# Patient Record
Sex: Female | Born: 1975 | Race: White | Hispanic: No | Marital: Married | Smoking: Never smoker
Health system: Southern US, Community
[De-identification: ages and names within clinical notes are randomized; demographics above are authoritative.]

---

## 2005-06-17 ENCOUNTER — Emergency Department: Payer: Self-pay | Admitting: Emergency Medicine

## 2006-10-04 ENCOUNTER — Emergency Department: Payer: Self-pay | Admitting: Emergency Medicine

## 2011-01-14 ENCOUNTER — Ambulatory Visit: Payer: Self-pay

## 2011-08-23 ENCOUNTER — Ambulatory Visit: Payer: Self-pay | Admitting: Family Medicine

## 2011-09-21 ENCOUNTER — Encounter: Payer: Self-pay | Admitting: Obstetrics & Gynecology

## 2011-10-10 ENCOUNTER — Emergency Department: Payer: Self-pay | Admitting: *Deleted

## 2011-10-10 LAB — URINALYSIS, COMPLETE
Bilirubin,UR: NEGATIVE
Leukocyte Esterase: NEGATIVE
Nitrite: NEGATIVE
Ph: 7 (ref 4.5–8.0)
Squamous Epithelial: <1

## 2012-02-13 ENCOUNTER — Ambulatory Visit: Payer: Self-pay | Admitting: Obstetrics and Gynecology

## 2012-02-13 LAB — CBC WITH DIFFERENTIAL/PLATELET
Basophil #: 0 10*3/uL (ref 0.0–0.1)
Basophil %: 0.3 %
Eosinophil #: 0.1 10*3/uL (ref 0.0–0.7)
Eosinophil %: 0.7 %
Lymphocyte #: 1.2 10*3/uL (ref 1.0–3.6)
Lymphocyte %: 14.6 %
MCH: 31.1 pg (ref 26.0–34.0)
MCHC: 34.2 g/dL (ref 32.0–36.0)
MCV: 91 fL (ref 80–100)
Monocyte #: 0.4 x10 3/mm (ref 0.2–0.9)
Monocyte %: 5.1 %
Neutrophil #: 6.3 10*3/uL (ref 1.4–6.5)
Neutrophil %: 79.3 %
RDW: 16 % — ABNORMAL HIGH (ref 11.5–14.5)
WBC: 8 10*3/uL (ref 3.6–11.0)

## 2012-02-14 ENCOUNTER — Inpatient Hospital Stay: Payer: Self-pay | Admitting: Obstetrics and Gynecology

## 2012-02-15 LAB — HEMATOCRIT: HCT: 26.5 % — ABNORMAL LOW (ref 35.0–47.0)

## 2013-01-20 IMAGING — US US OB US >=[ID] SNGL FETUS
1 series · 14 of 28 positions shown · non-contrast
Comparison: none

REASON FOR EXAM: CR [id number] Viability
COMMENTS:

[Series 1: us ob us >=(id) sngl fetus · 40 acquisitions, 14 frames shown]
[im 2/40]
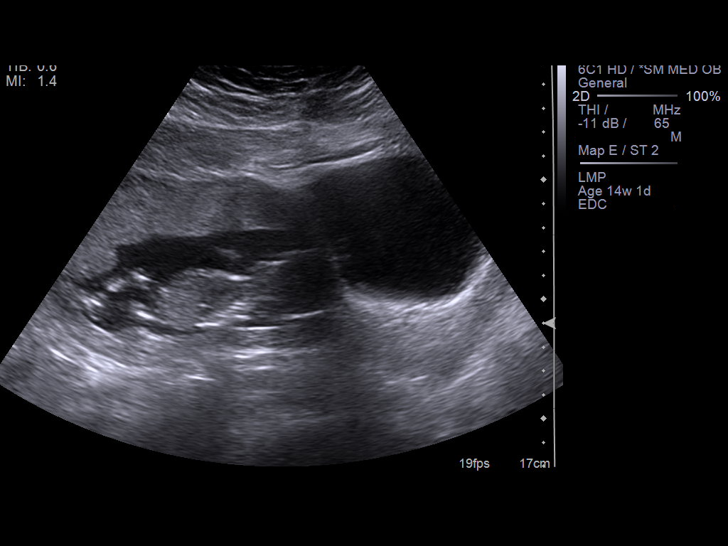
[im 5/40]
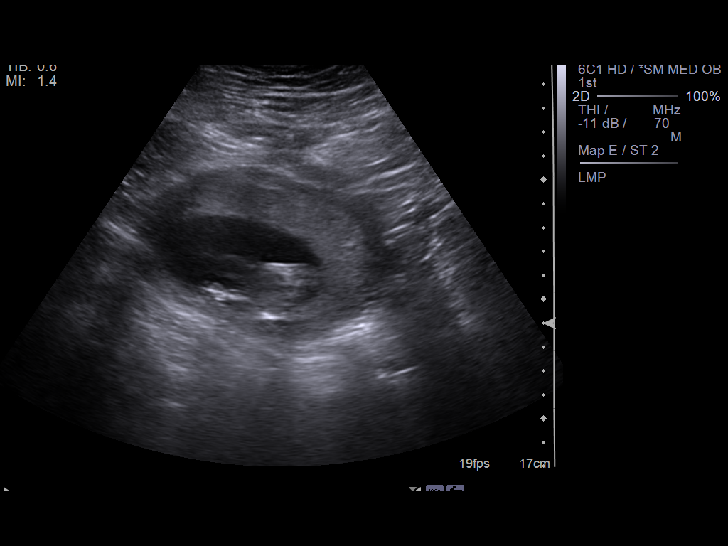
[im 8/40]
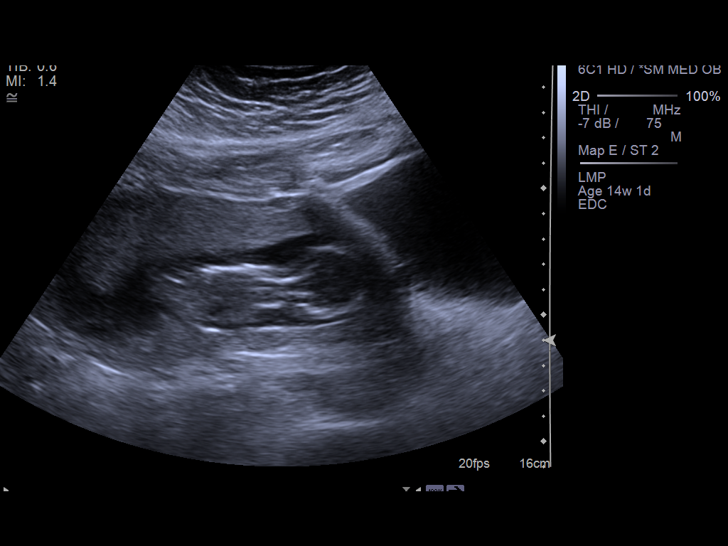
[im 11/40]
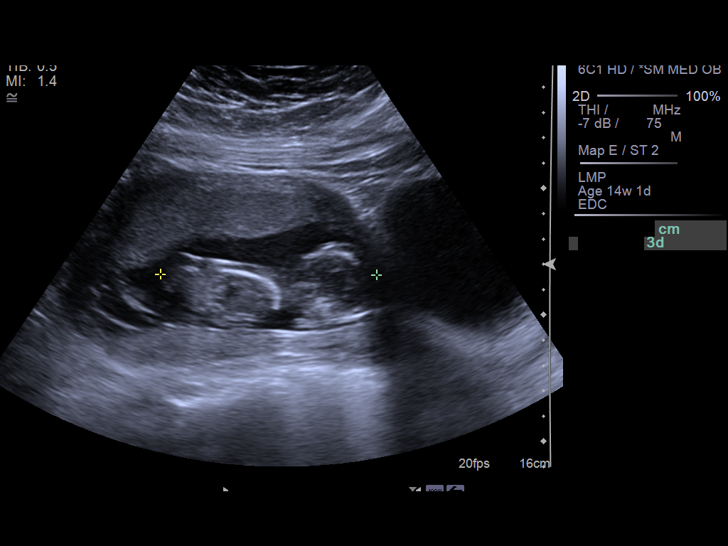
[im 14/40]
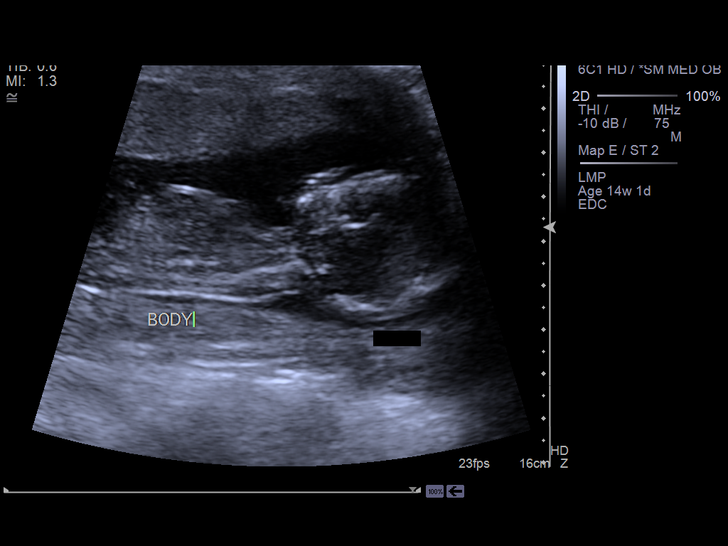
[im 16/40]
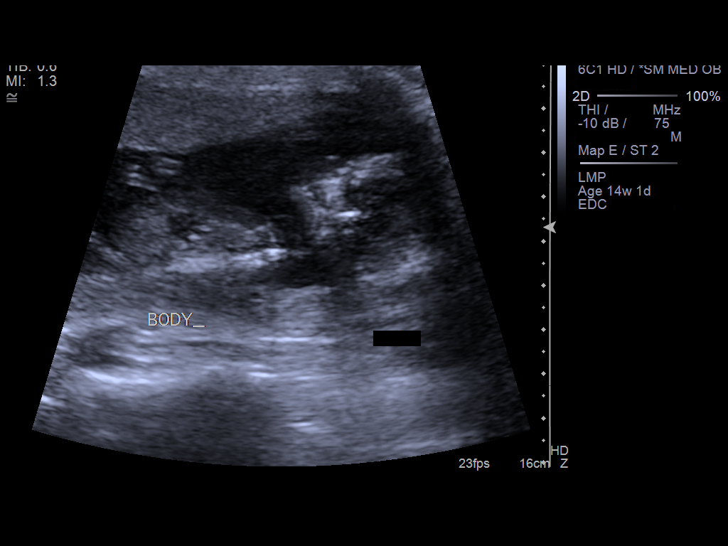
[im 19/40]
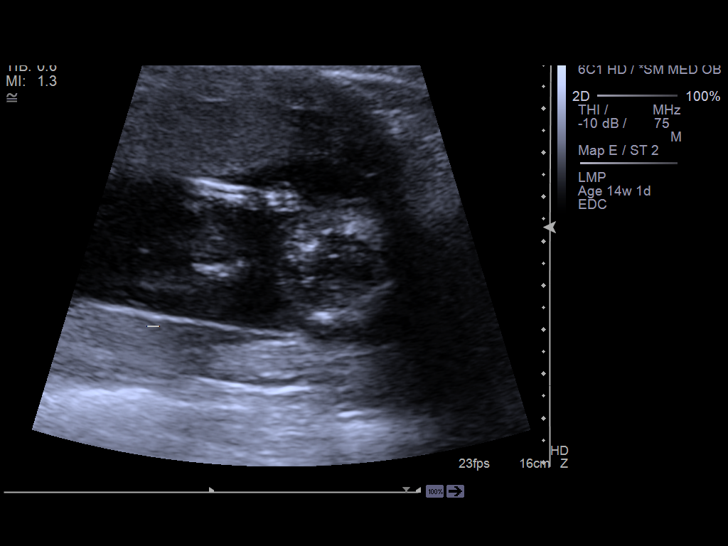
[im 22/40]
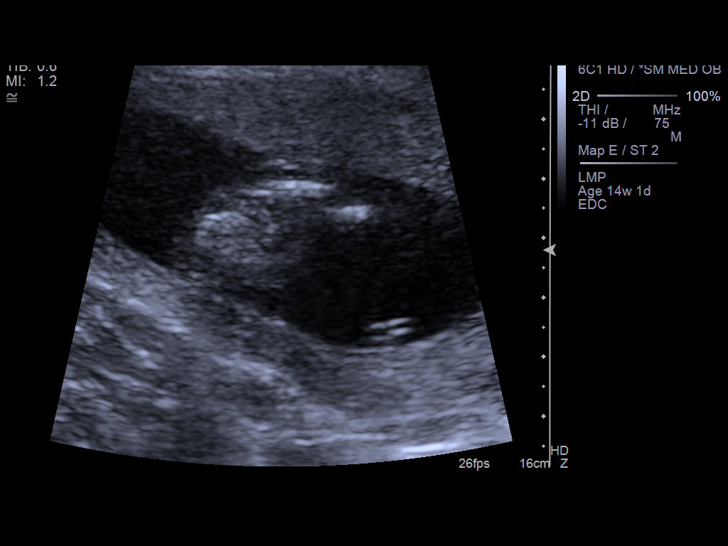
[im 25/40]
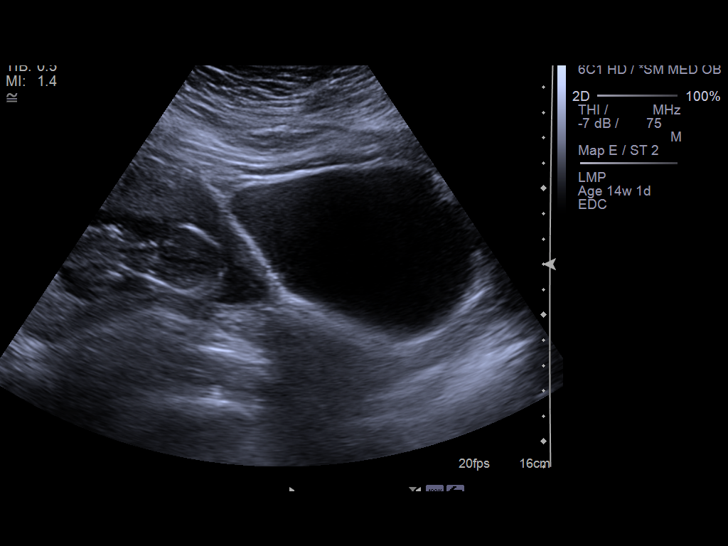
[im 28/40]
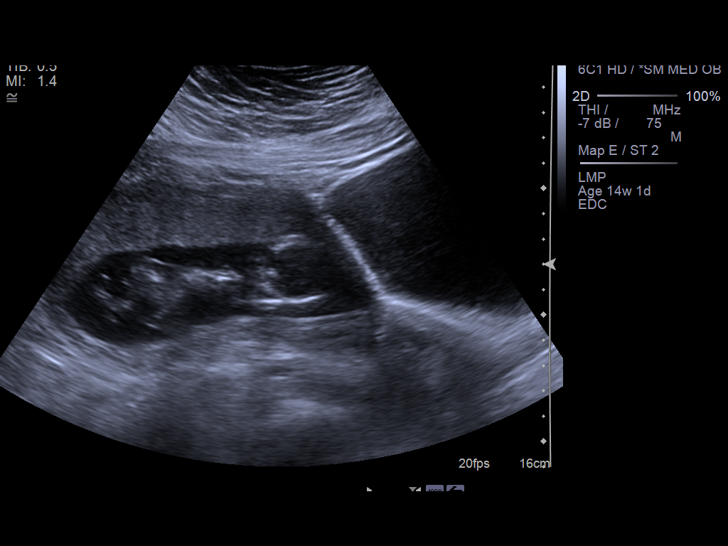
[im 31/40]
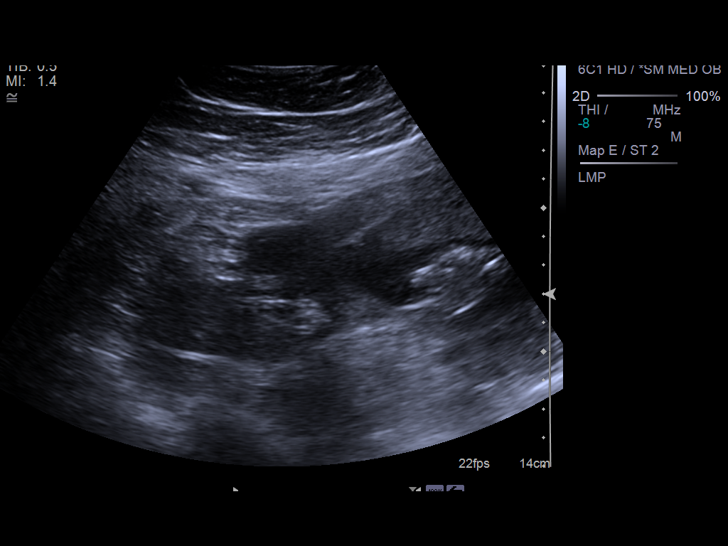
[im 34/40]
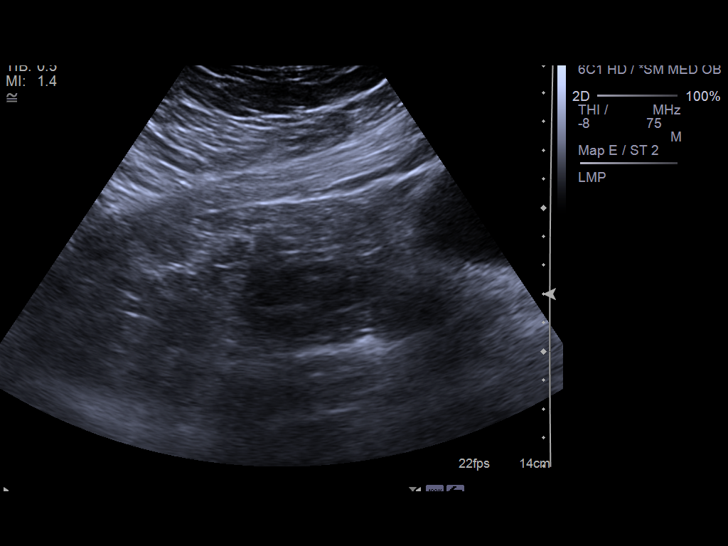
[im 37/40]
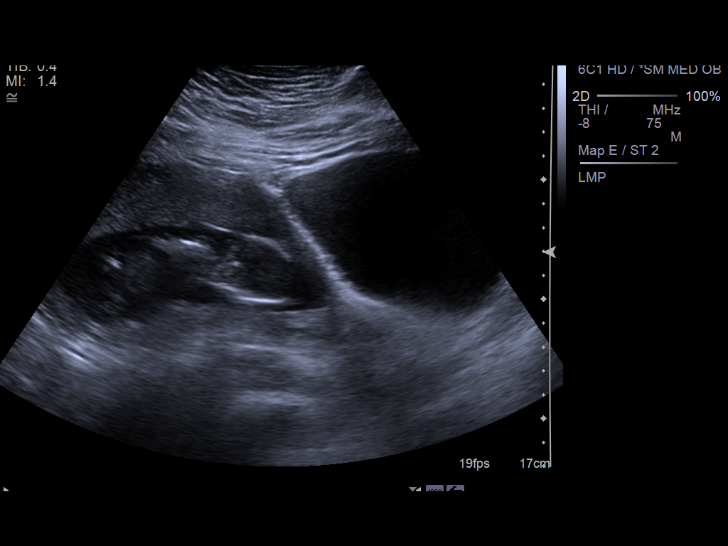
[im 40/40]
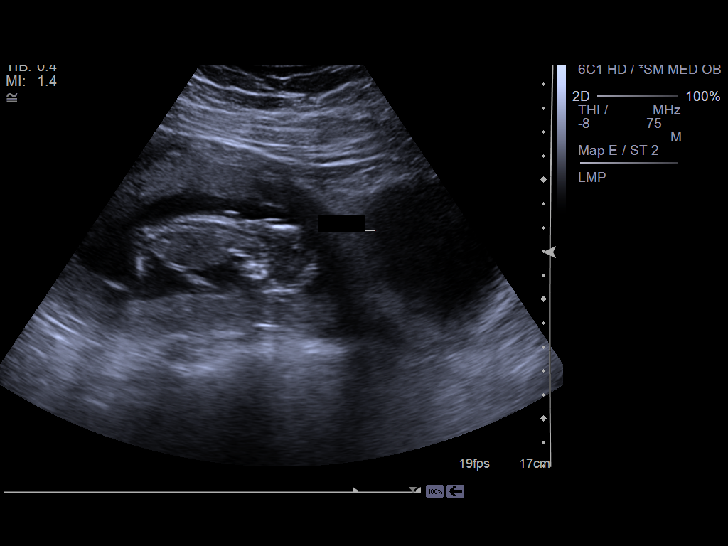

[14 of 28 positions shown; findings below may reference images not displayed]

PROCEDURE:     US  - US OB GREATER/OR EQUAL TO C0X9N  - August 23, 2011  [DATE]

RESULT:     There is a gravid uterus present. The crown-rump length of the
fetus measures 8.64 cm corresponding to an estimated gestational age of 14
weeks 4 days plus or minus 9 days. A fetal cardiac rate of 164 beats per
minute was demonstrated. The placenta is anterior and the distance of the
lower uterine segment to the internal cervical os is 3.3 cm.
IMPRESSION: There is a single viable intrauterine gestation with
cephalic presentation anterior placenta with estimated gestational age of 14
weeks 4 days + / - 9 days. The estimated date of confinement is [DATE]

[REDACTED]

## 2013-03-09 IMAGING — US US RENAL KIDNEY
1 series · 14 of 25 positions shown · non-contrast
Comparison: none

REASON FOR EXAM: left flank pain..pt is in [place]
COMMENTS:

[Series 1: us renal kidney · 0.33mm/px · 14 of 43 slices shown]
[im 1/43]
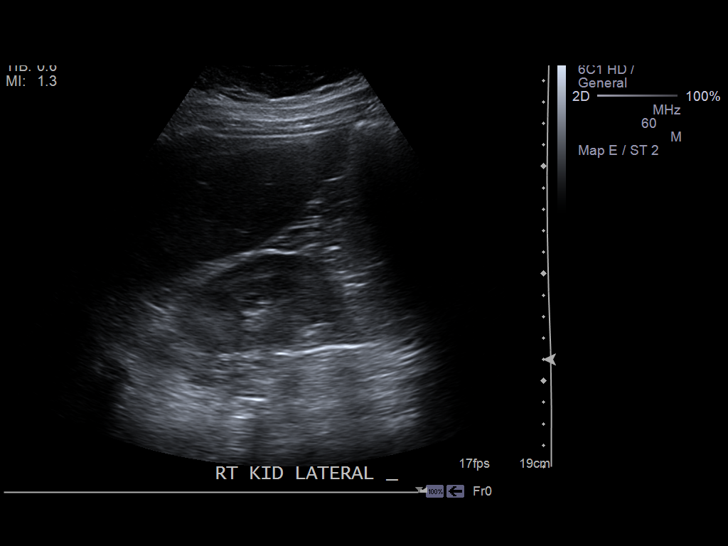
[im 4/43]
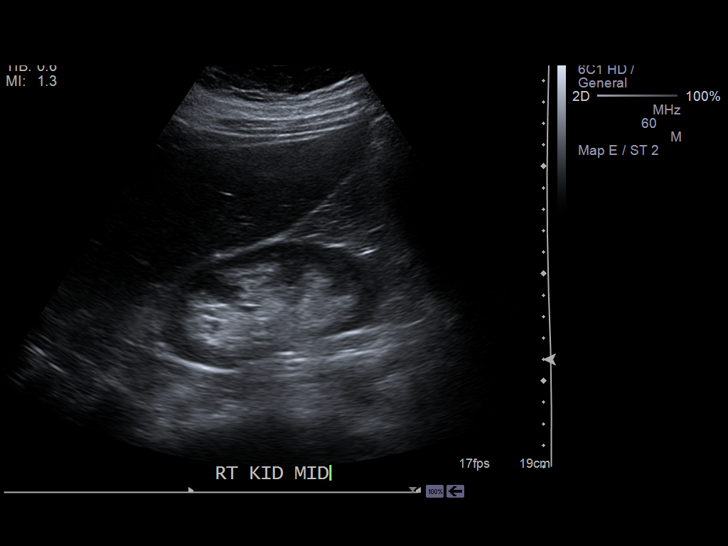
[im 8/43]
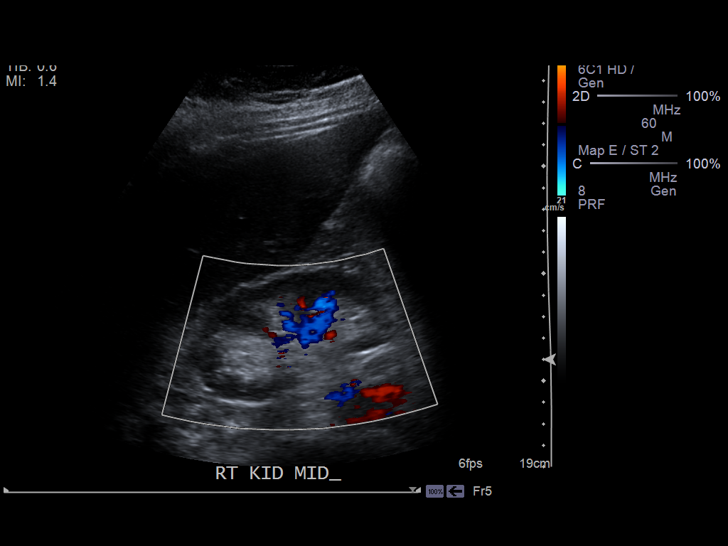
[im 11/43]
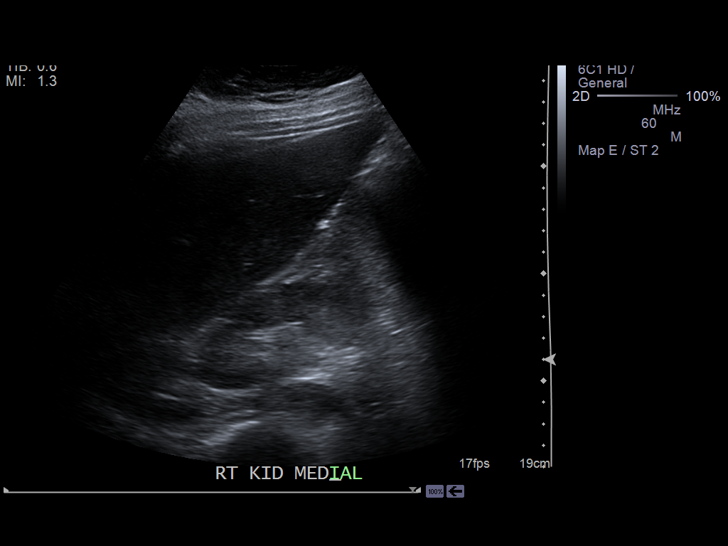
[im 15/43]
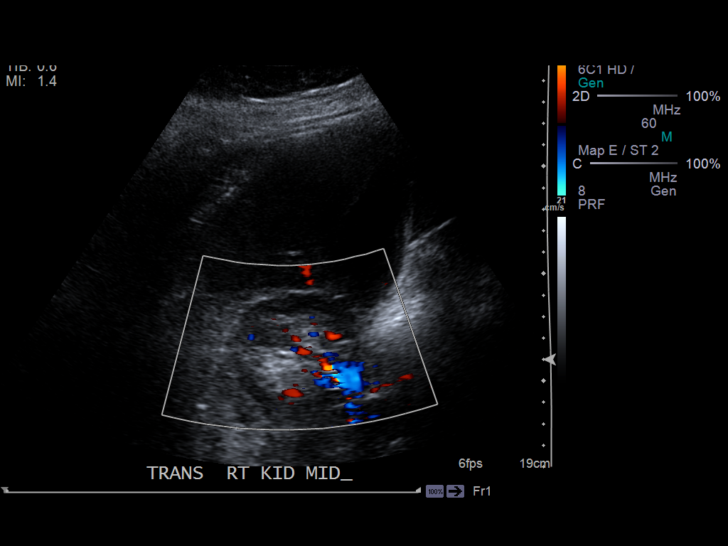
[im 16/43]
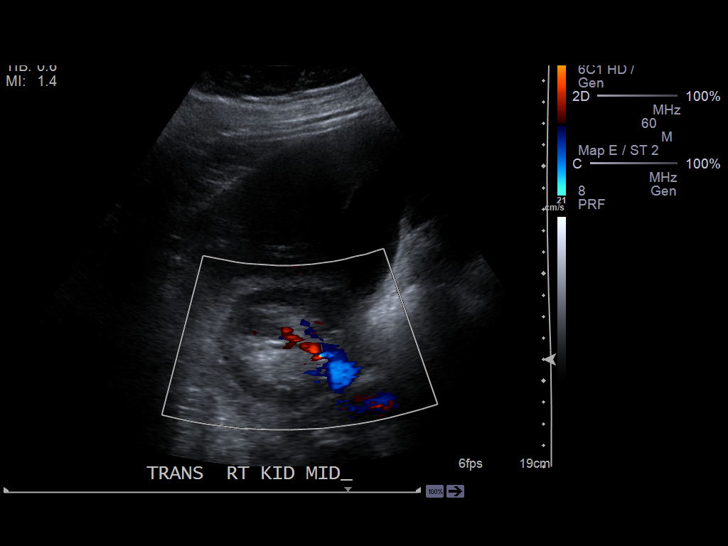
[im 20/43]
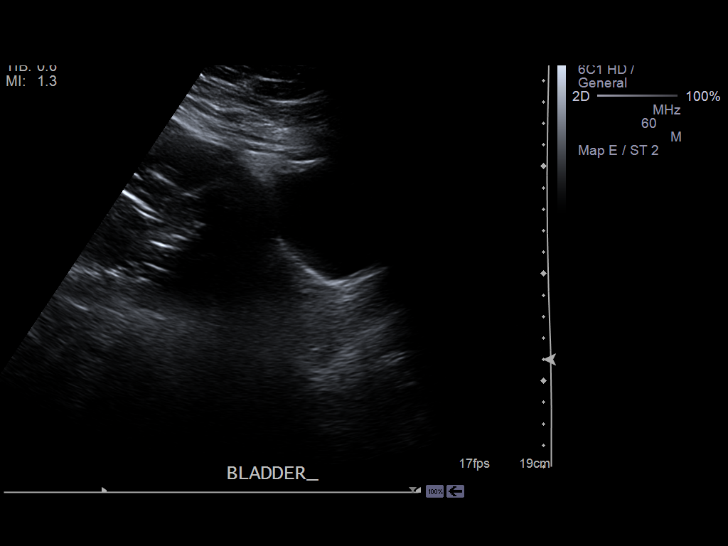
[im 23/43]
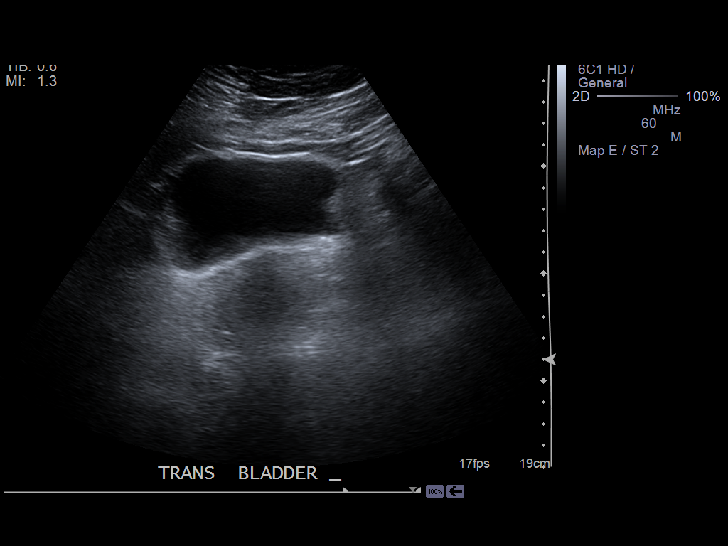
[im 27/43]
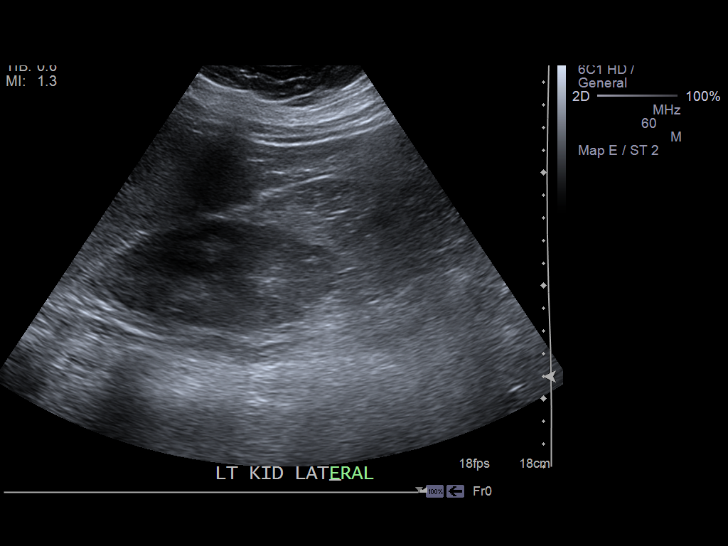
[im 29/43]
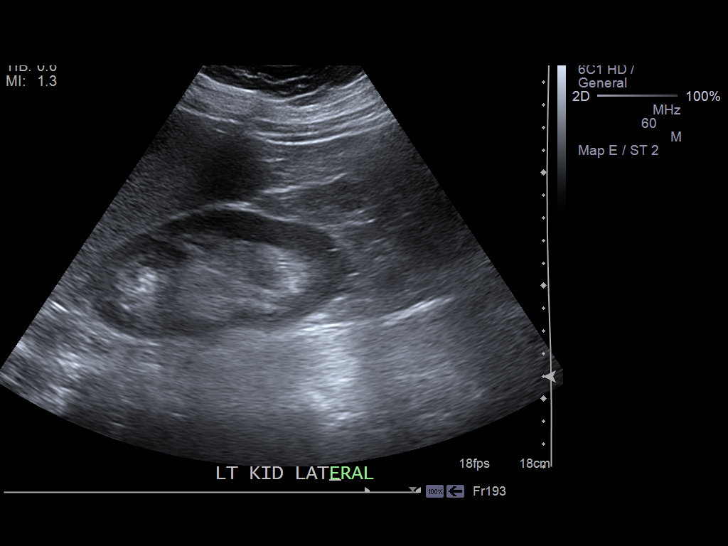
[im 32/43]
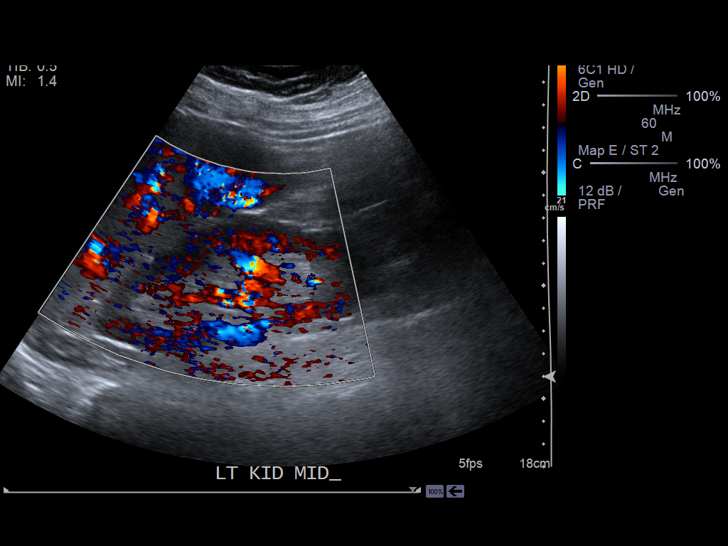
[im 36/43]
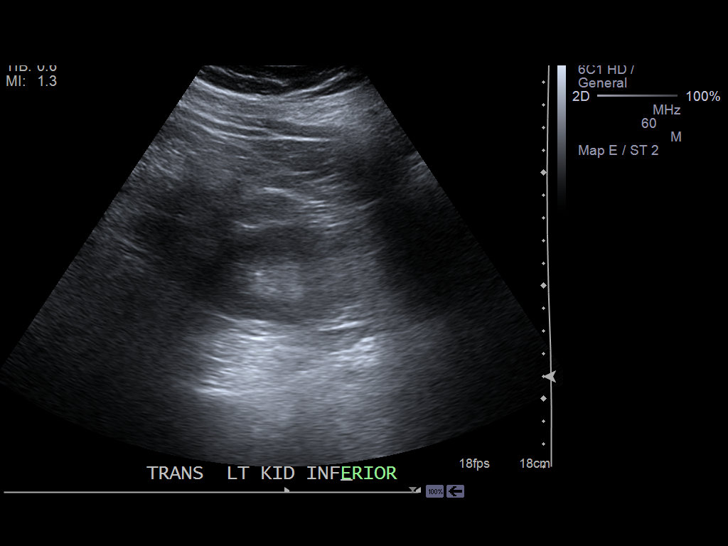
[im 39/43]
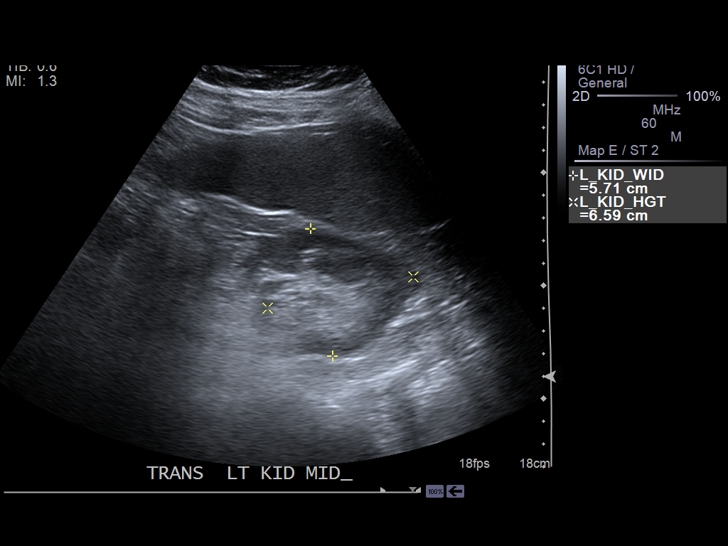
[im 43/43]
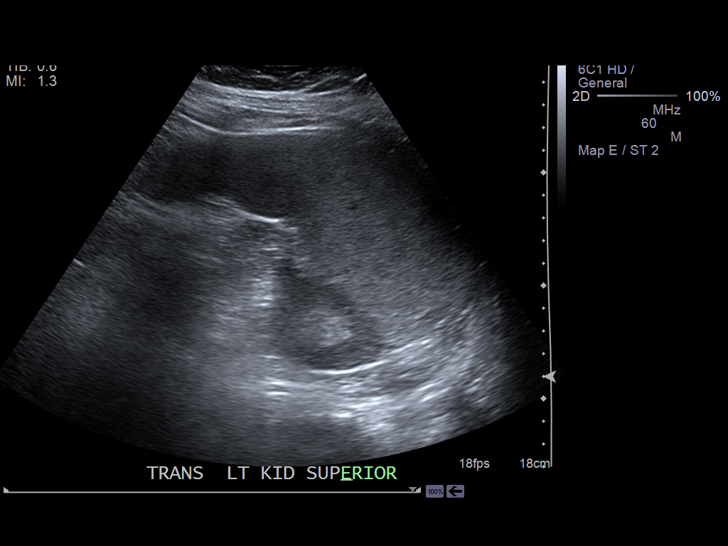

[14 of 25 positions shown; findings below may reference images not displayed]

PROCEDURE:     US  - US KIDNEY  - October 10, 2011  [DATE]

RESULT:     Renal ultrasound shows the right kidney measures 10.62 x 5.65 x
5.89 cm. The left kidney measures 12.51 x 5.71 x 6.59 cm. There is no
evidence of urinary obstruction. No stones are evident. The left renal
cortex appears to be slightly thinner than the right renal cortex.
IMPRESSION: 1. No evidence of urinary obstruction. No focal renal mass evident.

[REDACTED]

## 2014-05-15 NOTE — Op Note (Signed)
PATIENT NAME:  Maureen Porter, Maureen Porter MR#:  [id number] DATE OF BIRTH:  1975-08-20  DATE OF PROCEDURE:  02/14/2012  PREOPERATIVE DIAGNOSES:  1. Elective repeat cesarean section.  2. Elective permanent sterilization.  3. On-Q pump placement.   POSTOPERATIVE DIAGNOSES:  1. Elective repeat cesarean section.  2. Elective permanent sterilization.  3. On-Q pump placement.   PROCEDURES:  1. Repeat low transverse cesarean section.  2. Bilateral tubal ligation, [place].  3. On-Q pump placement. 4. Retrograde filling of the bladder.   SURGEON: Suzy Bouchardhomas J. Schermerhorn, MD  FIRST ASSISTANT: Street - Scrub Tech.   ANESTHESIA: Spinal.   INDICATION: This is a 39 year old gravida 7, para 3. The patient is status post prior cesarean section for abruption. The patient's EDC is 02/20/2012. The patient desires elective permanent sterilization.   DESCRIPTION OF PROCEDURE: Adequate spinal anesthesia. The patient had previously received 2 grams IV Ancef for prophylaxis. A Pfannenstiel incision was made two fingerbreadths above the symphysis pubis. Sharp symphysis pubis. Sharp dissection was used to identify the fascia and the fascia was opened in the midline and opened in a transverse fashion. The superior aspect of the fascia was grasped with Kocher  clamps. There were multiple loops of omentum that were scarred down to the fascia. These were taken down sharply and with the Bovie. Inferior aspect of the fascia was grasped with Kocher clamps and the pyramidalis muscle was dissected free. Entry into the peritoneal cavity was accomplished sharply. The vesicouterine peritoneal fold was identified and was opened in a transverse fashion and the bladder was reflected inferiorly. A low transverse uterine incision was made. Upon entry into the endometrial cavity, clear fluid resulted. The incision was extended with blunt transverse traction. The fetal head was brought to the incision and the vacuum was applied to the fetal  occiput. One gentle pull allowed for delivery of the head and the vacuum was removed. The shoulders and body were delivered without difficulty. A vigorous female was passed to Dr. Awanda MinkGaliote who assigned Apgar scores of 8 and 9. Placenta was manually delivered and 40 units of Pitocin administered in the IV bag. The uterus was exteriorized and the endometrial cavity was wiped clean with laparotomy tape and the ring forceps used to open the cervix. Uterine incision was closed with one chromic suture in a running locking fashion. The lower uterine segment was extremely attenuated and the bladder serosa was attenuated as well. After making the first suture line, in the uterus, the bladder serosa was repaired with 3-0 Vicryl suture. Given this defect, the bladder was retrograde and filled with indigo carmine with saline, 200 mL placed. There was no extravasation of indigo carmine intra-abdominally. Foley was replaced, bladder was drained. Two additional figure-of-eight sutures were applied at the uterine incision edge to control hemostasis. The fallopian tubes were then identified, fimbriated ends were visualized and, at the midportion of each fallopian tube, two separate 0 plain gut sutures were applied and a 1.5 cm portion of fallopian tube removed from each side and these will be sent to pathology for identification. Good hemostasis was noted. The posterior cul-de-sac was suctioned and irrigated and the uterus placed back into the abdominal cavity. The paracolic gutters were wiped clean with laparotomy tape and the tubal ligation sites appeared hemostatic still. The uterine incision appeared hemostatic. Interceed was placed over the uterine incision, in a T-shaped fashion. The superior aspect of the fascia was grasped with Kocher clamps and the On-Q pump catheters were placed from the infraumbilical incisions subfascially.  The fascia was closed above the On-Q pump catheters with 0 Vicryl suture with good hemostasis and  good approximation of tissue. Subcutaneous tissues were irrigated and bovied for hemostasis and the skin was reapproximated with staples. The On-Q pump catheters were secured with Dermabond, at the skin level, Steri-Stripped and Tegaderm was placed over top of this. Each catheter was filled with 5 mL of 0.5% Marcaine. There were no complications. Estimated blood loss 600 mL. Intraoperative fluids 2000 mL. The patient did receive 2 grams IV Ancef prior to commencement of the case. The patient was taken to the recovery room in good condition.  ____________________________ Suzy Bouchard, MD tjs:sb Porter: 02/14/2012 08:58:58 ET    T: 02/14/2012 09:56:56 ET        JOB#: [id number] cc: Suzy Bouchard, MD, <Dictator> Suzy Bouchard MD ELECTRONICALLY SIGNED 03/04/2012 9:02

## 2014-05-15 NOTE — Discharge Summary (Signed)
PATIENT NAME:  Maureen Porter, Griffin D MR#:  [id number] DATE OF BIRTH:  December 03, 1975  DATE OF ADMISSION:  02/14/2012 DATE OF DISCHARGE:  02/17/2012  HOSPITAL COURSE: The patient underwent repeat low transverse cesarean section and bilateral tubal ligation. Postoperative course was uncomplicated. The patient was discharged to home on postoperative day number 3 with vital signs stable, discharged to home in good condition. She will follow up Dr. Feliberto GottronSchermerhorn in 2 weeks for wound care. She will return before that time if she has wound drainage, fever, nausea or vomiting.   ____________________________ Suzy Bouchardhomas J. Mailen Newborn, MD tjs:sb D: 03/04/2012 09:04:03 ET T: 03/04/2012 09:27:55 ET JOB#: [id number]  cc: Suzy Bouchardhomas J. Kelsye Loomer, MD, <Dictator> Suzy BouchardHOMAS J Juley Giovanetti MD ELECTRONICALLY SIGNED 03/10/2012 10:01

## 2014-12-27 ENCOUNTER — Ambulatory Visit
Admission: EM | Admit: 2014-12-27 | Discharge: 2014-12-27 | Disposition: A | Discharge status: Home / Self Care | Payer: BLUE CROSS/BLUE SHIELD | Attending: Internal Medicine | Admitting: Internal Medicine

## 2014-12-27 ENCOUNTER — Encounter: Payer: Self-pay | Admitting: Emergency Medicine

## 2014-12-27 DIAGNOSIS — J019 Acute sinusitis, unspecified: Secondary | ICD-10-CM | POA: Diagnosis not present

## 2014-12-27 MED ORDER — PREDNISONE 50 MG PO TABS: 50.0000 mg | ORAL_TABLET | Freq: Every day | ORAL | Status: AC

## 2014-12-27 MED ORDER — AMOXICILLIN-POT CLAVULANATE 875-125 MG PO TABS: 1.0000 | ORAL_TABLET | Freq: Two times a day (BID) | ORAL | Status: AC

## 2014-12-27 MED ORDER — MOMETASONE FUROATE 50 MCG/ACT NA SUSP: 2.0000 | Freq: Every day | NASAL | Status: AC

## 2014-12-27 NOTE — ED Notes (Signed)
Pt reports 2 weeks of nasal congestion and now cough. Traveling soon out of country.

## 2014-12-27 NOTE — Discharge Instructions (Signed)
Prescriptions for augmentin (antibiotic), prednisone (for congestion) and nasal steroid spray were sent to the [facility]. Recheck for new fever >100.5, increasing phlegm production, or if not starting to improve in a few days.  Anticipate gradual improvement over the next 2-3 weeks.

## 2014-12-27 NOTE — ED Provider Notes (Addendum)
CSN: [id number]     Arrival date & time 12/27/14  1025 History   First MD Initiated Contact with Patient 12/27/14 1154     Chief Complaint  Patient presents with  . Nasal Congestion  . Cough   HPI  Patient is a 39 year old lady who presents with a two-week history of head congestion, sinus drainage/pressure, cough, now with some chest tightness and feeling worse again. Tactile temps in the evenings. Sore throat. Nausea, decreased appetite, but no vomiting, no diarrhea. Just not improving.  History reviewed. No pertinent past medical history. Past Surgical History  Procedure Laterality Date  . Cesarean section      2x   History reviewed. No pertinent family history. Social History  Substance Use Topics  . Smoking status: Never Smoker   . Smokeless tobacco: None  . Alcohol Use: None    Review of Systems  All other systems reviewed and are negative.   Allergies  Review of patient's allergies indicates no known allergies.  Home Medications   Takes no meds regularly                                 BP 120/71 mmHg  Pulse 64  Temp(Src) 98 F (36.7 C) (Oral)  Resp 18  Ht 5\' 3"  (1.6 m)  Wt 220 lb (99.791 kg)  BMI 38.98 kg/m2  SpO2 100%  LMP 12/05/2014 (Approximate)   Physical Exam  Constitutional: She is oriented to person, place, and time. No distress.  Alert, nicely groomed Voice sounds very congested. Looks ill, but not toxic. Sitting up on the end of the exam table  HENT:  Head: Atraumatic.  Right TM with marked dullness, no erythema, moderate dullness on the left. Marked nasal congestion with mucopurulent material present Throat red  Eyes:  Conjugate gaze, no eye redness/drainage  Neck: Neck supple.  Cardiovascular: Normal rate and regular rhythm.   Pulmonary/Chest: No respiratory distress. She has no wheezes. She has no rales.  Coarse but symmetric breath sounds  Abdominal: She exhibits no distension.  Musculoskeletal: Normal range of motion.  No leg  swelling  Neurological: She is alert and oriented to person, place, and time.  Skin: Skin is warm and dry.  No cyanosis  Nursing note and vitals reviewed.   ED Course  Procedures (including critical care time) none  MDM   1. Acute sinusitis with symptoms > 10 days    New Prescriptions   AMOXICILLIN-CLAVULANATE (AUGMENTIN) 875-125 MG TABLET    Take 1 tablet by mouth every 12 (twelve) hours.   MOMETASONE (NASONEX) 50 MCG/ACT NASAL SPRAY    Place 2 sprays into the nose daily.   PREDNISONE (DELTASONE) 50 MG TABLET    Take 1 tablet (50 mg total) by mouth daily.   Recheck for new fever greater than 100.5, increasing phlegm production, or if not starting to improve in a few days. Anticipate gradual improvement in cough and congestion over the next 2-3 weeks.    Eustace MooreLaura W Tahmir Kleckner, MD 12/27/14 1209  Eustace MooreLaura W Twisha Vanpelt, MD 12/28/14 [phone]

## 2015-05-31 ENCOUNTER — Encounter: Payer: Self-pay | Admitting: Internal Medicine

## 2019-05-31 ENCOUNTER — Ambulatory Visit: Payer: BLUE CROSS/BLUE SHIELD | Attending: Internal Medicine

## 2019-05-31 DIAGNOSIS — Z23 Encounter for immunization: Secondary | ICD-10-CM

## 2019-05-31 NOTE — Progress Notes (Signed)
   Covid-19 Vaccination Clinic  Name:  Ajeenah Nikitha Mode    MRN: [id number] DOB: April 21, 1975  05/31/2019  Ms. Sturgeon was observed post Covid-19 immunization for 15 minutes without incident. She was provided with Vaccine Information Sheet and instruction to access the V-Safe system.   Ms. Rocca was instructed to call 911 with any severe reactions post vaccine: Marland Kitchen Difficulty breathing  . Swelling of face and throat  . A fast heartbeat  . A bad rash all over body  . Dizziness and weakness   Immunizations Administered    Name Date Dose VIS Date Route   Pfizer COVID-19 Vaccine 05/31/2019  1:25 PM 0.3 mL 03/19/2018 Intramuscular   Manufacturer: [employer], [employer]   Lot: [id number]   NDC: [address]-0

## 2019-06-28 ENCOUNTER — Ambulatory Visit: Payer: BLUE CROSS/BLUE SHIELD | Attending: Internal Medicine

## 2019-06-28 DIAGNOSIS — Z23 Encounter for immunization: Secondary | ICD-10-CM

## 2019-06-28 NOTE — Progress Notes (Signed)
   Covid-19 Vaccination Clinic  Name:  Maureen Porter    MRN: [id number] DOB: 11-19-1975  06/28/2019  [name] was observed post Covid-19 immunization for 15 minutes without incident. She was provided with Vaccine Information Sheet and instruction to access the V-Safe system.   [name] was instructed to call 911 with any severe reactions post vaccine: Marland Kitchen Difficulty breathing  . Swelling of face and throat  . A fast heartbeat  . A bad rash all over body  . Dizziness and weakness   Immunizations Administered    Name Date Dose VIS Date Route   Pfizer COVID-19 Vaccine 06/28/2019 10:33 AM 0.3 mL 03/19/2018 Intramuscular   Manufacturer: [employer], [employer]   Lot: [id number]   NDC: [address]-2

## 2022-01-09 ENCOUNTER — Ambulatory Visit
Admission: RE | Admit: 2022-01-09 | Discharge: 2022-01-09 | Disposition: A | Discharge status: Home / Self Care | Payer: BLUE CROSS/BLUE SHIELD | Source: Ambulatory Visit | Attending: Family Medicine | Admitting: Family Medicine

## 2022-01-09 VITALS — BP 145/87 | HR 94 | Temp 97.8°F | Resp 16

## 2022-01-09 DIAGNOSIS — S39012A Strain of muscle, fascia and tendon of lower back, initial encounter: Secondary | ICD-10-CM | POA: Insufficient documentation

## 2022-01-09 DIAGNOSIS — N3090 Cystitis, unspecified without hematuria: Secondary | ICD-10-CM | POA: Insufficient documentation

## 2022-01-09 LAB — URINALYSIS, ROUTINE W REFLEX MICROSCOPIC
Bilirubin Urine: NEGATIVE
Glucose, UA: NEGATIVE mg/dL
Hgb urine dipstick: NEGATIVE
Ketones, ur: NEGATIVE mg/dL
Leukocytes,Ua: NEGATIVE
Nitrite: NEGATIVE
Protein, ur: NEGATIVE mg/dL
Specific Gravity, Urine: 1.025 (ref 1.005–1.030)
pH: 5.5 (ref 5.0–8.0)

## 2022-01-09 LAB — WET PREP, GENITAL
Clue Cells Wet Prep HPF POC: NONE SEEN
Sperm: NONE SEEN
Trich, Wet Prep: NONE SEEN
WBC, Wet Prep HPF POC: <10 — AB (ref <10)
Yeast Wet Prep HPF POC: NONE SEEN

## 2022-01-09 MED ORDER — NITROFURANTOIN MONOHYD MACRO 100 MG PO CAPS: 100.0000 mg | ORAL_CAPSULE | Freq: Two times a day (BID) | ORAL | 0 refills | Status: AC

## 2022-01-09 NOTE — ED Provider Notes (Signed)
[facility]    CSN: [id number] Arrival date & time: 01/09/22  1907      History   Chief Complaint Chief Complaint  Patient presents with   Back Pain    Lower back and abdominal pain foul smelling urine - Entered by patient   Dysuria   Urinary Frequency    HPI Maureen Porter is a 46 y.o. female presenst with low back pain, suprapubic pain, dysuria and urinary frequency x 1 week. She had some Cipro and took it for 3 days and when she saw her PCP on 11/29 for back pain and prescribed a muscle relaxer and told her that her UA was negative for infection. She states he R mid lumbar back is better, though is still sore. Four days ago, she noticed amonia odor urine, and cloudiness and recurrent dysuria. She Started taking Ampicillin 500 mg bid and has helped, but also saw some white discharge. She denies having sex with anyone else but her husband, but would like STD testing. Today she is having suprapubic pain, but also will be starting her period soon. She is leaving for [place] in 3 days and wants to make sure she is well. Denies fever.    History reviewed. No pertinent past medical history.  There are no problems to display for this patient.   Past Surgical History:  Procedure Laterality Date   CESAREAN SECTION     2x    OB History   No obstetric history on file.      Home Medications    Prior to Admission medications   Medication Sig Start Date End Date Taking? Authorizing Provider  nitrofurantoin, macrocrystal-monohydrate, (MACROBID) 100 MG capsule Take 1 capsule (100 mg total) by mouth 2 (two) times daily. 01/09/22  Yes Rodriguez-Southworth, Nettie Elm, PA-C  mometasone (NASONEX) 50 MCG/ACT nasal spray Place 2 sprays into the nose daily. 12/27/14   Isa Rankin, MD  predniSONE (DELTASONE) 50 MG tablet Take 1 tablet (50 mg total) by mouth daily. 12/27/14   Isa Rankin, MD    Family History History reviewed. No pertinent family  history.  Social History Social History   Tobacco Use   Smoking status: Never   Smokeless tobacco: Never  Vaping Use   Vaping Use: Never used  Substance Use Topics   Alcohol use: Not Currently   Drug use: Never     Allergies   Ciprofloxacin   Review of Systems Review of Systems  Constitutional:  Negative for fever.  Gastrointestinal:  Positive for abdominal pain. Negative for constipation.  Genitourinary:  Positive for dysuria, frequency, urgency and vaginal discharge. Negative for menstrual problem.     Physical Exam Triage Vital Signs ED Triage Vitals  Enc Vitals Group     BP 01/09/22 1934 (!) 145/87     Pulse Rate 01/09/22 1934 94     Resp 01/09/22 1934 16     Temp 01/09/22 1934 97.8 F (36.6 C)     Temp Source 01/09/22 1934 Oral     SpO2 01/09/22 1934 97 %     Weight --      Height --      Head Circumference --      Peak Flow --      Pain Score 01/09/22 1928 5     Pain Loc --      Pain Edu? --      Excl. in [place]? --    No data found.  Updated Vital Signs BP (!) 145/87 (  BP Location: Right Arm)   Pulse 94   Temp 97.8 F (36.6 C) (Oral)   Resp 16   LMP  (LMP Unknown)   SpO2 97%   Visual Acuity Right Eye Distance:   Left Eye Distance:   Bilateral Distance:    Right Eye Near:   Left Eye Near:    Bilateral Near:      Physical Exam Vitals and nursing note reviewed.  Constitutional:      General: She is not in acute distress.    Appearance: She is not toxic-appearing.  HENT:     Head: Normocephalic.     Right Ear: External ear normal.     Left Ear: External ear normal.  Eyes:     General: No scleral icterus.    Conjunctiva/sclera: Conjunctivae normal.  Pulmonary:     Effort: Pulmonary effort is normal.  Abdominal:     General: Bowel sounds are normal.     Palpations: Abdomen is soft. There is no mass.     Tenderness: has mild suprapubic tenderness. There is no guarding or rebound.     Comments: +- CVA tenderness   Musculoskeletal:         General: Normal range of motion.     Cervical back: Neck supple.     Comments: BACK- has local muscular tenderness on area of complaint with palpation  Skin:    General: Skin is warm and dry.     Findings: No rash.  Neurological:     Mental Status: She is alert and oriented to person, place, and time.     Gait: Gait normal.  Psychiatric:        Mood and Affect: Mood normal.        Behavior: Behavior normal.        Thought Content: Thought content normal.        Judgment: Judgment normal.    UC Treatments / Results  Labs (all labs ordered are listed, but only abnormal results are displayed) Labs Reviewed  WET PREP, GENITAL - Abnormal; Notable for the following components:      Result Value   WBC, Wet Prep HPF POC <10 (*)    All other components within normal limits  URINALYSIS, ROUTINE W REFLEX MICROSCOPIC  CERVICOVAGINAL ANCILLARY ONLY    EKG   Radiology No results found.  Procedures Procedures (including critical care time)  Medications Ordered in UC Medications - No data to display  Initial Impression / Assessment and Plan / UC Course  I have reviewed the triage vital signs and the nursing notes.  Pertinent labs  results that were available during my care of the patient were reviewed by me and considered in my medical decision making (see chart for details).  Dysuria Muscular back pain  I ordered GC and Chlamydia and we will inform her if positive, but she has Mychart to check on results as well. I placed her on Macrobid for 5 days to complete treatment for UTI. She was educated about interstitial cystitis, and told next time to not take any meds and have UA done.   Final Clinical Impressions(s) / UC Diagnoses   Final diagnoses:  Cystitis  Strain of lumbar region, initial encounter     Discharge Instructions      You may continue your muscle relaxer's  If your test comes back positive, you will need Ceftriaxone for gonorrhea and Doxycycline for  chlamydia      ED Prescriptions     Medication Sig  Dispense Auth. Provider   nitrofurantoin, macrocrystal-monohydrate, (MACROBID) 100 MG capsule Take 1 capsule (100 mg total) by mouth 2 (two) times daily. 10 capsule Rodriguez-Southworth, Nettie Elm, PA-C      PDMP not reviewed this encounter.   Garey Ham, Cordelia Poche 01/09/22 2150

## 2022-01-09 NOTE — Discharge Instructions (Addendum)
You may continue your muscle relaxer's  If your test comes back positive, you will need Ceftriaxone for gonorrhea and Doxycycline for chlamydia

## 2022-01-09 NOTE — ED Triage Notes (Signed)
Pt presents with lower back pain, dysuria, lower abdominal pain and urinary frequency x 1 week. Pt was seen by her PCP on 11/29 for back pain and prescribed a muscle relaxer and was told her urine was clear.

## 2022-01-10 ENCOUNTER — Encounter: Payer: Self-pay | Admitting: *Deleted

## 2022-01-10 ENCOUNTER — Emergency Department
Admission: EM | Admit: 2022-01-10 | Discharge: 2022-01-11 | Disposition: A | Discharge status: Home / Self Care | Payer: Self-pay | Attending: Emergency Medicine | Admitting: Emergency Medicine

## 2022-01-10 ENCOUNTER — Telehealth: Payer: Self-pay

## 2022-01-10 ENCOUNTER — Other Ambulatory Visit: Payer: Self-pay

## 2022-01-10 DIAGNOSIS — R1030 Lower abdominal pain, unspecified: Secondary | ICD-10-CM

## 2022-01-10 DIAGNOSIS — R1031 Right lower quadrant pain: Secondary | ICD-10-CM | POA: Insufficient documentation

## 2022-01-10 DIAGNOSIS — M545 Low back pain, unspecified: Secondary | ICD-10-CM | POA: Insufficient documentation

## 2022-01-10 LAB — POC URINE PREG, ED: Preg Test, Ur: NEGATIVE

## 2022-01-10 LAB — URINALYSIS, ROUTINE W REFLEX MICROSCOPIC
Bilirubin Urine: NEGATIVE
Glucose, UA: NEGATIVE mg/dL
Hgb urine dipstick: NEGATIVE
Ketones, ur: NEGATIVE mg/dL
Leukocytes,Ua: NEGATIVE
Nitrite: NEGATIVE
Protein, ur: NEGATIVE mg/dL
Specific Gravity, Urine: 1.003 — ABNORMAL LOW (ref 1.005–1.030)
pH: 6 (ref 5.0–8.0)

## 2022-01-10 NOTE — Telephone Encounter (Signed)
Returned patient call back. Reviewed results. Shared with patient that STD swab results not back. Voiced understanding.

## 2022-01-10 NOTE — ED Triage Notes (Addendum)
Pt has right lower back pain for 1 month.  Pt was seen last night at [facility] urgent care.  No known injury to back  pt has vag discharge.  No vag bleeding.  Pt alert.   Pt taking macrobid and reports feeling cold.  No rash.

## 2022-01-11 ENCOUNTER — Emergency Department: Payer: Self-pay

## 2022-01-11 LAB — CBC
HCT: 38.1 % (ref 36.0–46.0)
Hemoglobin: 12.7 g/dL (ref 12.0–15.0)
MCH: 29.1 pg (ref 26.0–34.0)
MCHC: 33.3 g/dL (ref 30.0–36.0)
MCV: 87.2 fL (ref 80.0–100.0)
Platelets: 171 10*3/uL (ref 150–400)
RBC: 4.37 MIL/uL (ref 3.87–5.11)
RDW: 13.9 % (ref 11.5–15.5)
WBC: 8 10*3/uL (ref 4.0–10.5)
nRBC: 0 % (ref 0.0–0.2)

## 2022-01-11 LAB — CERVICOVAGINAL ANCILLARY ONLY
Chlamydia: NEGATIVE
Comment: NEGATIVE
Comment: NORMAL
Neisseria Gonorrhea: NEGATIVE

## 2022-01-11 LAB — COMPREHENSIVE METABOLIC PANEL
ALT: 19 U/L (ref 0–44)
AST: 21 U/L (ref 15–41)
Albumin: 4 g/dL (ref 3.5–5.0)
Alkaline Phosphatase: 57 U/L (ref 38–126)
Anion gap: 7 (ref 5–15)
BUN: 11 mg/dL (ref 6–20)
CO2: 27 mmol/L (ref 22–32)
Calcium: 8.8 mg/dL — ABNORMAL LOW (ref 8.9–10.3)
Chloride: 103 mmol/L (ref 98–111)
Creatinine, Ser: 0.7 mg/dL (ref 0.44–1.00)
GFR, Estimated: >60 mL/min (ref >60)
Glucose, Bld: 109 mg/dL — ABNORMAL HIGH (ref 70–99)
Potassium: 3.6 mmol/L (ref 3.5–5.1)
Sodium: 137 mmol/L (ref 135–145)
Total Bilirubin: 0.7 mg/dL (ref 0.3–1.2)
Total Protein: 7.5 g/dL (ref 6.5–8.1)

## 2022-01-11 LAB — LIPASE, BLOOD: Lipase: 37 U/L (ref 11–51)

## 2022-01-11 NOTE — ED Provider Notes (Signed)
[facility] Provider Note    Event Date/Time   First MD Initiated Contact with Patient 01/11/22 [phone]     (approximate)  History   Chief Complaint: Back Pain  HPI  Maureen Porter is a 46 y.o. female with no significant past medical history who presents to the emergency department for 1 month of lower back pain intermittent abdominal pain and urinary symptoms.  According to the patient for the past month or so she has intermittently been experiencing some urinary discomfort and some lower back pain consistent with past urinary tract infections.  Patient was seen by her doctor placed on antibiotics although she states her urine sample was negative.  Patient was told it could just be more musculoskeletal pain in the lower back she was placed on ibuprofen and states that did help for some time but now the pain is come back.  Patient denies any urinary symptoms currently.  Denies any blood in the urine.  States she is due for her menstrual cycle any day now.  Patient went to urgent care yesterday (12/18) she had a urinalysis done and prescribed Macrobid although again her urine sample was negative, had a pelvic exam done wet prep was negative.  Patient states she is leaving the country tomorrow to visit family in [place] and wanted to be sure that she is not missing something before she leaves the country.  Physical Exam   Triage Vital Signs: ED Triage Vitals  Enc Vitals Group     BP 01/10/22 2112 (!) 152/98     Pulse Rate 01/10/22 2112 93     Resp 01/10/22 2112 18     Temp 01/10/22 2112 98.5 F (36.9 C)     Temp Source 01/10/22 2112 Oral     SpO2 01/10/22 2112 99 %     Weight 01/10/22 2114 240 lb (108.9 kg)     Height 01/10/22 2114 5\' 3"  (1.6 m)     Head Circumference --      Peak Flow --      Pain Score 01/10/22 2114 4     Pain Loc --      Pain Edu? --      Excl. in [place]? --     Most recent vital signs: Vitals:   01/10/22 2112  BP: (!) 152/98  Pulse: 93   Resp: 18  Temp: 98.5 F (36.9 C)  SpO2: 99%    General: Awake, no distress.  CV:  Good peripheral perfusion.  Regular rate and rhythm  Resp:  Normal effort.  Equal breath sounds bilaterally.  Abd:  No distention.  Soft, slight right lower quadrant tenderness palpation.  Otherwise benign abdomen without rebound or guarding   ED Results / Procedures / Treatments   RADIOLOGY  I have reviewed and interpreted the CT images.  I do not see any obvious abnormality or sign of obstruction on my evaluation. CT scan shows bilateral renal stones but no other acute abnormality   MEDICATIONS ORDERED IN ED: Medications - No data to display   IMPRESSION / MDM / [place] / ED COURSE  I reviewed the triage vital signs and the nursing notes.  Patient's presentation is most consistent with acute presentation with potential threat to life or bodily function.  Patient presents emergency department for low back pain, states mostly off to the right side/right flank pain intermittent urinary symptoms.  Patient has been seen by her doctor as well as urgent care for the same  and has not found an answer for her discomfort besides possible urinary tract infection although the patient states both times the urinalysis and once her culture have come back negative.  Patient is currently prescribed Macrobid.  Patient is urinalysis here appears normal no sign of infection, pregnancy test negative.  However given 1 month of intermittent symptoms I reviewed the patient's chart she has not had lab work or any imaging performed.  I believe it would be beneficial to the patient to rule out other intra-abdominal pathology such as tumor mass or infectious etiology.  We will check labs including a CBC, chemistry, lipase obtain CT renal scan given her mostly right flank pain to rule out ureterolithiasis.  Patient agreeable to plan.  Lab work shows a reassuringly normal CBC, reassuring chemistry, CT scan is negative  for acute abnormality.  Discussed with the patient to continue to use Tylenol or ibuprofen if needed for discomfort as written on the box.  She is also been prescribed Flexeril to use at nighttime by her doctor.  Discussed with the patient follow-up with her PCP.  Patient agreeable to plan.  FINAL CLINICAL IMPRESSION(S) / ED DIAGNOSES   Right flank pain Abdominal pain  Note:  This document was prepared using Dragon voice recognition software and may include unintentional dictation errors.   Minna Antis, MD 01/11/22 0120
